# Patient Record
Sex: Male | Born: 1980 | Hispanic: No | Marital: Married | Smoking: Never smoker
Health system: Southern US, Community
[De-identification: ages and names within clinical notes are randomized; demographics above are authoritative.]

## PROBLEM LIST (undated history)

## (undated) DIAGNOSIS — M549 Dorsalgia, unspecified: Secondary | ICD-10-CM

## (undated) HISTORY — PX: ANKLE SURGERY: SHX546

---

## 2000-05-23 ENCOUNTER — Encounter: Payer: Self-pay | Admitting: Emergency Medicine

## 2000-05-23 ENCOUNTER — Emergency Department (HOSPITAL_COMMUNITY): Admission: EM | Admit: 2000-05-23 | Discharge: 2000-05-23 | Payer: Self-pay | Admitting: Emergency Medicine

## 2000-06-03 ENCOUNTER — Ambulatory Visit (HOSPITAL_COMMUNITY): Admission: RE | Admit: 2000-06-03 | Discharge: 2000-06-03 | Payer: Self-pay | Admitting: Neurology

## 2000-06-03 ENCOUNTER — Encounter: Payer: Self-pay | Admitting: Neurology

## 2004-11-30 ENCOUNTER — Emergency Department: Payer: Self-pay | Admitting: Emergency Medicine

## 2004-12-01 ENCOUNTER — Ambulatory Visit: Payer: Self-pay | Admitting: Emergency Medicine

## 2005-07-08 ENCOUNTER — Emergency Department: Payer: Self-pay | Admitting: Emergency Medicine

## 2008-07-30 ENCOUNTER — Encounter: Admission: RE | Admit: 2008-07-30 | Discharge: 2008-07-30 | Payer: Self-pay | Admitting: Family Medicine

## 2009-07-04 ENCOUNTER — Emergency Department: Payer: Self-pay | Admitting: Emergency Medicine

## 2009-10-05 ENCOUNTER — Emergency Department: Payer: Self-pay | Admitting: Emergency Medicine

## 2010-11-10 ENCOUNTER — Ambulatory Visit: Payer: Self-pay | Admitting: Family Medicine

## 2017-02-02 ENCOUNTER — Encounter: Payer: Self-pay | Admitting: *Deleted

## 2018-04-20 ENCOUNTER — Encounter: Payer: Self-pay | Admitting: Emergency Medicine

## 2018-04-20 ENCOUNTER — Other Ambulatory Visit: Payer: Self-pay

## 2018-04-20 ENCOUNTER — Emergency Department
Admission: EM | Admit: 2018-04-20 | Discharge: 2018-04-20 | Disposition: A | Discharge status: Home / Self Care | Payer: Worker's Compensation | Attending: Emergency Medicine | Admitting: Emergency Medicine

## 2018-04-20 DIAGNOSIS — Z79899 Other long term (current) drug therapy: Secondary | ICD-10-CM | POA: Insufficient documentation

## 2018-04-20 DIAGNOSIS — M545 Low back pain: Secondary | ICD-10-CM | POA: Diagnosis present

## 2018-04-20 DIAGNOSIS — M5442 Lumbago with sciatica, left side: Secondary | ICD-10-CM | POA: Diagnosis not present

## 2018-04-20 DIAGNOSIS — G8929 Other chronic pain: Secondary | ICD-10-CM

## 2018-04-20 HISTORY — DX: Dorsalgia, unspecified: M54.9

## 2018-04-20 MED ORDER — PREDNISONE 10 MG PO TABS: ORAL_TABLET | ORAL | 0 refills | Status: DC

## 2018-04-20 MED ORDER — HYDROMORPHONE HCL 1 MG/ML IJ SOLN
1.5000 mg | INTRAMUSCULAR | Status: AC
  Administered 2018-04-20: 1.5 mg via SUBCUTANEOUS
  Filled 2018-04-20: qty 2

## 2018-04-20 MED ORDER — PREDNISONE 20 MG PO TABS
60.0000 mg | ORAL_TABLET | ORAL | Status: AC
  Administered 2018-04-20: 60 mg via ORAL
  Filled 2018-04-20: qty 3

## 2018-04-20 MED ORDER — CYCLOBENZAPRINE HCL 5 MG PO TABS: 5.0000 mg | ORAL_TABLET | Freq: Three times a day (TID) | ORAL | 0 refills | Status: DC | PRN

## 2018-04-20 MED ORDER — LIDOCAINE 5 % EX PTCH
1.0000 | MEDICATED_PATCH | CUTANEOUS | Status: DC
  Administered 2018-04-20: 1 via TRANSDERMAL
  Filled 2018-04-20: qty 1

## 2018-04-20 MED ORDER — OXYCODONE-ACETAMINOPHEN 5-325 MG PO TABS: 2.0000 | ORAL_TABLET | Freq: Four times a day (QID) | ORAL | 0 refills | Status: DC | PRN

## 2018-04-20 MED ORDER — LIDOCAINE 5 % EX PTCH: 1.0000 | MEDICATED_PATCH | Freq: Two times a day (BID) | CUTANEOUS | 0 refills | Status: DC
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
## 2018-04-20 NOTE — Discharge Instructions (Addendum)
As we discussed, we recommend you try the medications prescribed tonight in addition to the medications you are currently taking.  It is important that you call your regular back doctor later today and tell them how much you are hurting after your physical therapy session.  I also provided 2 additional doctors with whom you can follow-up: A neurosurgeon for a second opinion, and a pain management doctor who may be able to help you with your chronic pain.  Take Percocet as prescribed for severe pain. Do not drink alcohol, drive or participate in any other potentially dangerous activities while taking this medication as it may make you sleepy. Do not take this medication with any other sedating medications, either prescription or over-the-counter. If you were prescribed Percocet or Vicodin, do not take these with acetaminophen (Tylenol) as it is already contained within these medications.   This medication is an opiate (or narcotic) pain medication and can be habit forming.  Use it as little as possible to achieve adequate pain control.  Do not use or use it with extreme caution if you have a history of opiate abuse or dependence.  If you are on a pain contract with your primary care doctor or a pain specialist, be sure to let them know you were prescribed this medication today from the [facility] Emergency Department.  This medication is intended for your use only - do not give any to anyone else and keep it in a secure place where nobody else, especially children, have access to it.  It will also cause or worsen constipation, so you may want to consider taking an over-the-counter stool softener while you are taking this medication.   Please follow up with your doctor as soon as possible regarding today's ED visit and your back pain.  Return to the ED for worsening back pain, fever, weakness or numbness of either leg, or if you develop either (1) an inability to urinate or have bowel movements, or (2)  loss of your ability to control your bathroom functions (if you start having "accidents"), or if you develop other new symptoms that concern you.
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
## 2018-12-17 ENCOUNTER — Other Ambulatory Visit: Payer: Self-pay | Admitting: Orthopaedic Surgery

## 2018-12-17 DIAGNOSIS — G8929 Other chronic pain: Secondary | ICD-10-CM

## 2018-12-18 ENCOUNTER — Telehealth: Payer: Self-pay | Admitting: Nurse Practitioner

## 2018-12-18 NOTE — Telephone Encounter (Signed)
Phone call to patient to verify medication list and allergies for myelogram procedure. Pt aware he will not need to hold and medications for this procedure. Pre and post procedure instructions reviewed with pt. Pt verbalized understanding.

## 2018-12-25 ENCOUNTER — Ambulatory Visit
Admission: RE | Admit: 2018-12-25 | Discharge: 2018-12-25 | Disposition: A | Discharge status: Home / Self Care | Payer: Self-pay | Source: Ambulatory Visit | Attending: Orthopaedic Surgery | Admitting: Orthopaedic Surgery

## 2018-12-25 ENCOUNTER — Other Ambulatory Visit: Payer: Self-pay | Admitting: Orthopaedic Surgery

## 2018-12-25 ENCOUNTER — Other Ambulatory Visit: Payer: Self-pay

## 2018-12-25 ENCOUNTER — Ambulatory Visit
Admission: RE | Admit: 2018-12-25 | Discharge: 2018-12-25 | Disposition: A | Discharge status: Home / Self Care | Payer: Worker's Compensation | Source: Ambulatory Visit | Attending: Orthopaedic Surgery | Admitting: Orthopaedic Surgery

## 2018-12-25 DIAGNOSIS — R52 Pain, unspecified: Secondary | ICD-10-CM

## 2018-12-25 DIAGNOSIS — M545 Low back pain, unspecified: Secondary | ICD-10-CM

## 2018-12-25 DIAGNOSIS — G8929 Other chronic pain: Secondary | ICD-10-CM

## 2018-12-25 MED ORDER — MEPERIDINE HCL 50 MG/ML IJ SOLN
50.0000 mg | Freq: Once | INTRAMUSCULAR | Status: AC
  Administered 2018-12-25: 50 mg via INTRAMUSCULAR

## 2018-12-25 MED ORDER — DIAZEPAM 5 MG PO TABS: 10.0000 mg | ORAL_TABLET | Freq: Once | ORAL | Status: DC

## 2018-12-25 MED ORDER — DIAZEPAM 5 MG PO TABS
10.0000 mg | ORAL_TABLET | Freq: Once | ORAL | Status: AC
  Administered 2018-12-25: 5 mg via ORAL

## 2018-12-25 MED ORDER — IOPAMIDOL (ISOVUE-M 200) INJECTION 41%
15.0000 mL | Freq: Once | INTRAMUSCULAR | Status: AC
  Administered 2018-12-25: 15 mL via INTRATHECAL

## 2018-12-25 MED ORDER — ONDANSETRON HCL 4 MG/2ML IJ SOLN
4.0000 mg | Freq: Once | INTRAMUSCULAR | Status: AC
  Administered 2018-12-25: 4 mg via INTRAMUSCULAR

## 2018-12-25 NOTE — Discharge Instructions (Signed)

## 2020-11-21 IMAGING — CT CT L SPINE W/ CM
1 of 6 series · 6 of 14 positions shown, 8 images · non-contrast
Comparison: 03/26/2018 lumbar spine MRI from [HOSPITAL]

CLINICAL DATA: Low back and left leg pain extending to the knee.
TECHNIQUE: Contiguous axial images were obtained through the Lumbar spine after
the intrathecal infusion of infusion. Coronal and sagittal
reconstructions were obtained of the axial image sets.

[Series 3: l spine soft · axial · 0.33mm/px · z∈[-299,-119]mm · 6 of 84 slices shown, 8 images]
[im 12/84  soft-tissue]
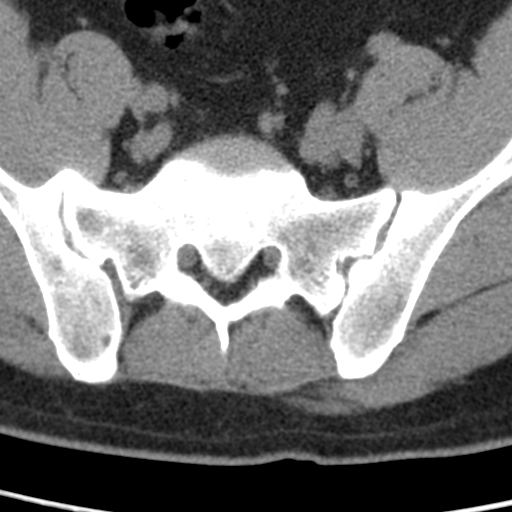
[im 12/84  bone]
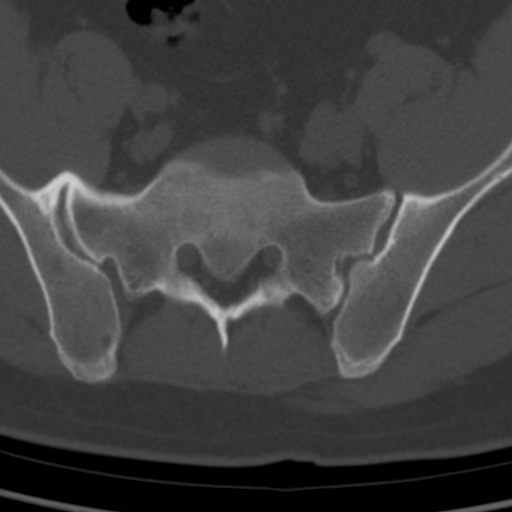
[im 24/84  bone]
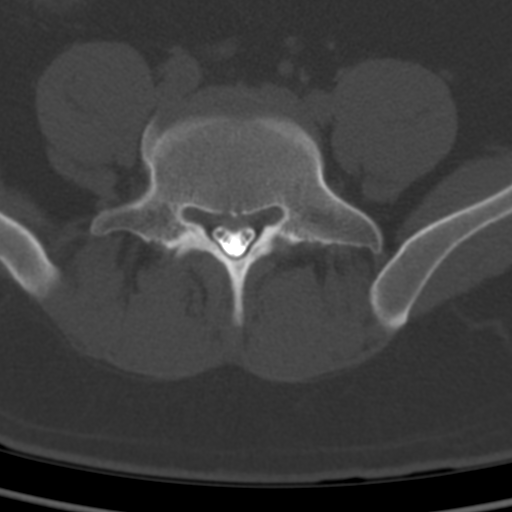
[im 36/84  bone]
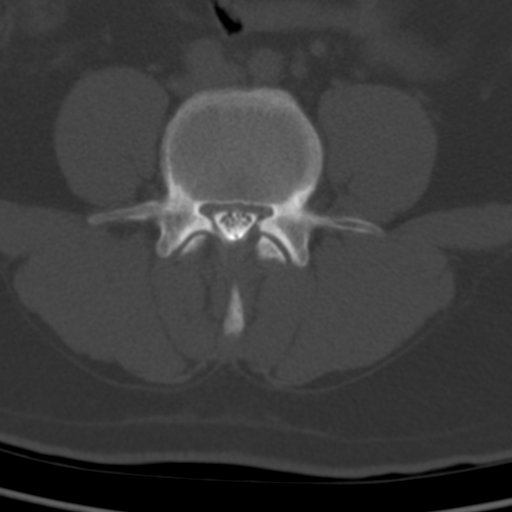
[im 48/84  bone]
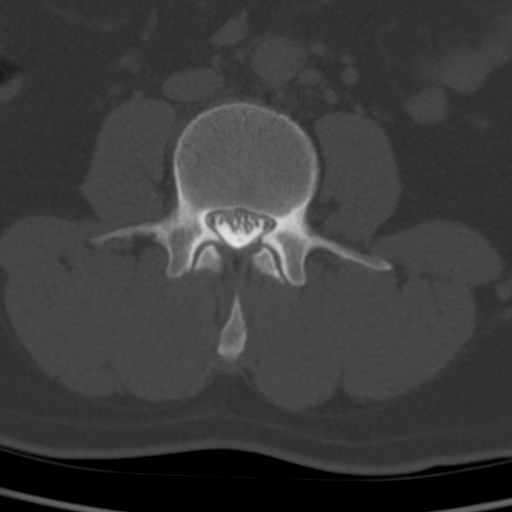
[im 60/84  soft-tissue]
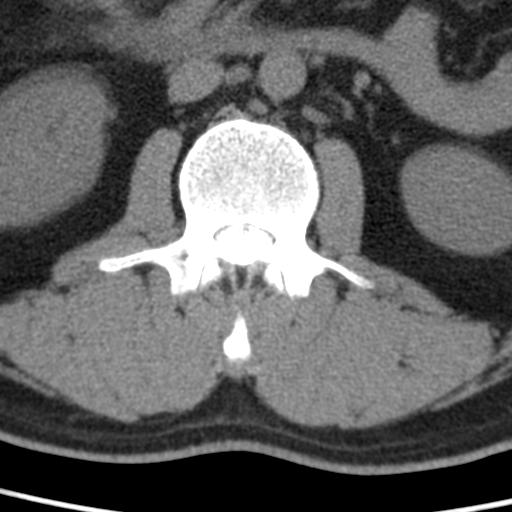
[im 60/84  bone]
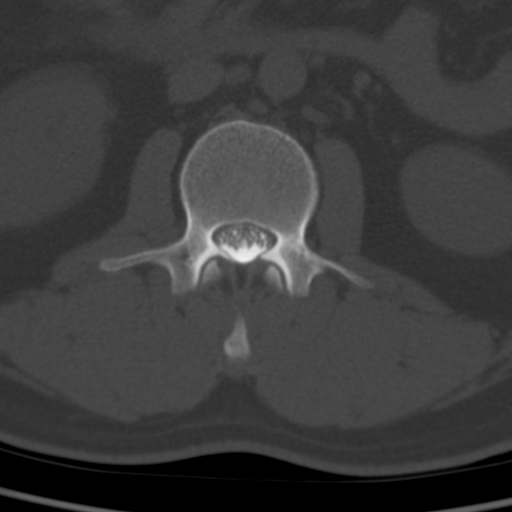
[im 72/84  bone]
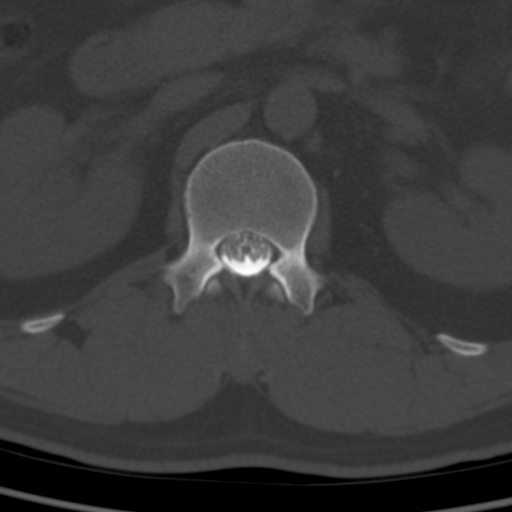

[6 of 14 positions shown; findings below may reference images not displayed]

EXAM:
LUMBAR MYELOGRAM

FLUOROSCOPY TIME:  Fluoroscopy Time: 13 seconds

Radiation Exposure Index: 261.09 microGray*m^2

PROCEDURE:
After thorough discussion of risks and benefits of the procedure
including bleeding, infection, injury to nerves, blood vessels,
adjacent structures as well as headache and CSF leak, written and
oral informed consent was obtained. Consent was obtained by Dr.
Laramie Ajayi. Time out form was completed.

Patient was positioned prone on the fluoroscopy table. Local
anesthesia was provided with 1% lidocaine without epinephrine after
prepped and draped in the usual sterile fashion. Puncture was
performed at L3-4 using a 3 1/2 inch 22-gauge spinal needle via a
right interlaminar approach. Using a single pass through the dura,
the needle was placed within the thecal sac, with return of clear
CSF. 15 mL of Isovue 0-S44 was injected into the thecal sac, with
normal opacification of the nerve roots and cauda equina consistent
with free flow within the subarachnoid space.

I personally performed the lumbar puncture and administered the
intrathecal contrast. I also personally supervised acquisition of
the myelogram images.
FINDINGS: LUMBAR MYELOGRAM FINDINGS:

There are 5 non rib-bearing lumbar vertebrae. The left L5 transverse
process is mildly enlarged and demonstrates a pseudoarticulation
with the sacrum. Vertebral alignment is normal, and no abnormal
motion is evident on flexion and extension radiographs. There is a
small ventral extradural defect at L4-5 which is slightly more
prominent with standing. There is no evidence of significant spinal
stenosis, and the other lumbar levels are unremarkable. There is
symmetric opacification of the nerve root sleeves.

CT LUMBAR MYELOGRAM FINDINGS:

Vertebral alignment is normal. No fracture or suspicious osseous
lesion is identified. The conus medullaris terminates at L1. There
is a punctate nonobstructing calculus in the upper pole of the left
kidney.

T12-L1 through L2-3: Negative.

L3-4: Minimal disc bulging, minimal endplate spurring, congenitally
short pedicles result in borderline to mild right neural foraminal
stenosis without spinal stenosis, unchanged.

L4-5: Mild disc space narrowing. Mild disc bulging, mild facet and
ligamentum flavum hypertrophy, and congenitally short pedicles
result in mild-to-moderate bilateral lateral recess stenosis and
mild-to-moderate right and mild left neural foraminal stenosis
without significant generalized spinal stenosis, unchanged. The L5
nerve roots could be affected in the lateral recesses.

L5-S1 negative.
IMPRESSION: 1. Mild disc and facet degeneration at L4-5 with mild-to-moderate
lateral recess and neural foraminal stenosis.
2. Mild right neural foraminal stenosis at L3-4.
3. Nonobstructing left renal calculus.

## 2022-03-22 ENCOUNTER — Emergency Department: Payer: Self-pay

## 2022-03-22 DIAGNOSIS — R0789 Other chest pain: Secondary | ICD-10-CM | POA: Insufficient documentation

## 2022-03-22 LAB — COMPREHENSIVE METABOLIC PANEL
ALT: 27 U/L (ref 0–44)
AST: 27 U/L (ref 15–41)
Albumin: 4.4 g/dL (ref 3.5–5.0)
Alkaline Phosphatase: 69 U/L (ref 38–126)
Anion gap: 11 (ref 5–15)
BUN: 23 mg/dL — ABNORMAL HIGH (ref 6–20)
CO2: 25 mmol/L (ref 22–32)
Calcium: 9.1 mg/dL (ref 8.9–10.3)
Chloride: 102 mmol/L (ref 98–111)
Creatinine, Ser: 1.02 mg/dL (ref 0.61–1.24)
GFR, Estimated: >60 mL/min (ref >60)
Glucose, Bld: 97 mg/dL (ref 70–99)
Potassium: 3.9 mmol/L (ref 3.5–5.1)
Sodium: 138 mmol/L (ref 135–145)
Total Bilirubin: 1.3 mg/dL — ABNORMAL HIGH (ref 0.3–1.2)
Total Protein: 7.1 g/dL (ref 6.5–8.1)

## 2022-03-22 LAB — TROPONIN I (HIGH SENSITIVITY)
Troponin I (High Sensitivity): 3 ng/L (ref <18)
Troponin I (High Sensitivity): <2 ng/L (ref <18)

## 2022-03-22 LAB — CBC WITH DIFFERENTIAL/PLATELET
Abs Immature Granulocytes: 0.03 10*3/uL (ref 0.00–0.07)
Basophils Absolute: 0 10*3/uL (ref 0.0–0.1)
Basophils Relative: 0 %
Eosinophils Absolute: 0.2 10*3/uL (ref 0.0–0.5)
Eosinophils Relative: 3 %
HCT: 46 % (ref 39.0–52.0)
Hemoglobin: 16.3 g/dL (ref 13.0–17.0)
Immature Granulocytes: 0 %
Lymphocytes Relative: 37 %
Lymphs Abs: 2.5 10*3/uL (ref 0.7–4.0)
MCH: 29.6 pg (ref 26.0–34.0)
MCHC: 35.4 g/dL (ref 30.0–36.0)
MCV: 83.6 fL (ref 80.0–100.0)
Monocytes Absolute: 0.5 10*3/uL (ref 0.1–1.0)
Monocytes Relative: 7 %
Neutro Abs: 3.5 10*3/uL (ref 1.7–7.7)
Neutrophils Relative %: 53 %
Platelets: 213 10*3/uL (ref 150–400)
RBC: 5.5 MIL/uL (ref 4.22–5.81)
RDW: 11.9 % (ref 11.5–15.5)
WBC: 6.7 10*3/uL (ref 4.0–10.5)
nRBC: 0 % (ref 0.0–0.2)

## 2022-03-22 NOTE — ED Triage Notes (Signed)
Ambulatory to triage with c/o hypertension, chest pain, and left arm pain. Recently began medication for hypertension but unsure what.  Sx began before Christmas, but pt states after BP readings at home tonight he became more concerned. Unsure how high BP was at home.

## 2022-03-22 NOTE — ED Provider Triage Note (Signed)
Emergency Medicine Provider Triage Evaluation Note  Jeffery Pena, a 42 y.o. male  was evaluated in triage.  Pt complains of elevated BP readings. Pt recently started on HTN meds, unclear of name. He also endorses chest pain and left arm pain.   Review of Systems  Positive: CP, high BP Negative: FCS  Physical Exam  BP (!) 133/94 (BP Location: Right Arm)   Pulse 89   Temp 98.4 F (36.9 C) (Oral)   Resp 18   Ht 5\' 10"  (1.778 m)   Wt 86.2 kg   SpO2 99%   BMI 27.26 kg/m  Gen:   Awake, no distress  NAD Resp:  Normal effort CTA MSK:   Moves extremities without difficulty  Other:    Medical Decision Making  Medically screening exam initiated at 8:27 PM.  Appropriate orders placed.  Jeffery Pena was informed that the remainder of the evaluation will be completed by another provider, this initial triage assessment does not replace that evaluation, and the importance of remaining in the ED until their evaluation is complete.  Patient to the ED for evaluation of elevated BP, CP, and LUE pain.    Melvenia Needles, PA-C 03/22/22 2028

## 2022-03-23 ENCOUNTER — Emergency Department
Admission: EM | Admit: 2022-03-23 | Discharge: 2022-03-23 | Disposition: A | Discharge status: Home / Self Care | Payer: Self-pay | Attending: Emergency Medicine | Admitting: Emergency Medicine

## 2022-03-23 DIAGNOSIS — R079 Chest pain, unspecified: Secondary | ICD-10-CM

## 2022-03-23 DIAGNOSIS — R0789 Other chest pain: Secondary | ICD-10-CM

## 2022-03-23 NOTE — Discharge Instructions (Signed)
Tome paracetamol 650 mg e ibuprofeno 400 mg cada 6 horas para el dolor. Tomar con la comida.   Gracias por elegirnos para el cuidado de su salud hoy!  Consulte a su mdico de atencin primaria esta semana para una cita de seguimiento.  Si no tiene un mdico de atencin primaria llame a las siguientes clnicas para establecer la atencin:  Si tienes seguro: [facility] [phone] [address] [place] [address]   [facility] [phone] [address] [place] [address]   Si no tienes seguro: [facility] [phone] [address] [place] [address]  A veces, en las primeras etapas del curso de ciertas enfermedades es difcil detectarlo en la evaluacin del departamento de emergencias; por lo tanto, es importante que contine monitoreando sus sntomas y llame a su mdico de inmediato o regrese al departamento de emergencias si desarrolla alguno. sntomas nuevos o que empeoran.  Fue un placer cuidar de usted hoy.  Mclean Moya S. Franki Alcaide, MD   

## 2022-03-23 NOTE — ED Provider Notes (Signed)
[facility] Provider Note    Event Date/Time   First MD Initiated Contact with Patient 03/23/22 0030     (approximate)   History   Chest Pain   HPI  Claud Engelbert Sevin is a 42 y.o. male   Past medical history of no significant past medical history presents to the emergency department with chest pain.  Started around Christmas time and has been constant and slightly progressing in severity over the last several weeks.  Left-sided chest pain radiates to the arm left side.  Nonexertional, no respiratory infectious complaints no other acute medical complaints.  Non-smoker no cardiac risk factors known to this patient.  No drug use no cocaine use.  No other exacerbating or alleviating symptoms.  No injuries.    History was obtained via the patient via [place] interpreter.  His son is available as independent historian at bedside.      Physical Exam   Triage Vital Signs: ED Triage Vitals  Enc Vitals Group     BP 03/22/22 2010 (!) 133/94     Pulse Rate 03/22/22 2010 89     Resp 03/22/22 2010 18     Temp 03/22/22 2010 98.4 F (36.9 C)     Temp Source 03/22/22 2010 Oral     SpO2 03/22/22 2010 99 %     Weight 03/22/22 2011 190 lb (86.2 kg)     Height 03/22/22 2011 5\' 10"  (1.778 m)     Head Circumference --      Peak Flow --      Pain Score 03/22/22 2011 4     Pain Loc --      Pain Edu? --      Excl. in [place]? --     Most recent vital signs: Vitals:   03/22/22 2010 03/22/22 2241  BP: (!) 133/94 (!) 147/96  Pulse: 89 71  Resp: 18 20  Temp: 98.4 F (36.9 C)   SpO2: 99% 98%    General: Awake, no distress.  CV:  Good peripheral perfusion.  Resp:  Normal effort.  Abd:  No distention.  Other:  Wake alert oriented comfortable appearing.  Heart sounds without murmurs, lungs clear to auscultation abdomen soft and nontender radial pulses intact and equal bilaterally.  Slightly hypertensive 130s over 90s otherwise hemodynamics appropriate  reassuring.   ED Results / Procedures / Treatments   Labs (all labs ordered are listed, but only abnormal results are displayed) Labs Reviewed  COMPREHENSIVE METABOLIC PANEL - Abnormal; Notable for the following components:      Result Value   BUN 23 (*)    Total Bilirubin 1.3 (*)    All other components within normal limits  CBC WITH DIFFERENTIAL/PLATELET  TROPONIN I (HIGH SENSITIVITY)  TROPONIN I (HIGH SENSITIVITY)     I reviewed labs and they are notable for troponin x 2 flat neg  EKG  ED ECG REPORT I, Lucillie Garfinkel, the attending physician, personally viewed and interpreted this ECG.   Date: 03/23/2022  EKG Time: 2014  Rate: 87  Rhythm: normal sinus rhythm  Axis: nl  Intervals:none  ST&T Change: No acute ischemic changes    RADIOLOGY I independently reviewed and interpreted chest x-ray and see no obvious focalities or pneumothorax   PROCEDURES:  Critical Care performed: No  Procedures   MEDICATIONS ORDERED IN ED: Medications - No data to display    IMPRESSION / MDM / [place] / ED COURSE  I reviewed the triage vital signs  and the nursing notes.                              Differential diagnosis includes, but is not limited to, ACS, PE, dissection, musculoskeletal pain, respiratory infection    MDM: This is a patient with atypical chest pain and no cardiac risk factors with negative troponin x 2 and a nonischemic EKG.  Very low clinical suspicion for dissection or PE.  No abdominal complaints and nontender abdomen so I doubt emergent abdominal pathologies.  He was informed to follow-up with PMD and referral made to cardiology.  Return precautions given.  Patient's presentation is most consistent with acute presentation with potential threat to life or bodily function.       FINAL CLINICAL IMPRESSION(S) / ED DIAGNOSES   Final diagnoses:  Atypical chest pain  Chest pain, unspecified type     Rx / DC Orders   ED Discharge  Orders          Ordered    Ambulatory referral to Cardiology        03/23/22 0113             Note:  This document was prepared using Dragon voice recognition software and may include unintentional dictation errors.    Lucillie Garfinkel, MD 03/23/22 [phone]

## 2022-05-04 ENCOUNTER — Ambulatory Visit: Payer: Self-pay | Attending: Internal Medicine | Admitting: Internal Medicine

## 2022-05-04 NOTE — Progress Notes (Deleted)
   New Outpatient Visit Date: 05/04/2022  Referring Provider: [facility] [place],  [place] [address]  Chief Complaint: ***  HPI:  Mr. Jeffery Pena is a 42 y.o. male who is being seen today for the evaluation of chest pain at the request of Dr. Jacelyn Grip. He has a history of ***. ***  --------------------------------------------------------------------------------------------------  Cardiovascular History & Procedures: Cardiovascular Problems: ***  Risk Factors: ***  Cath/PCI: ***  CV Surgery: ***  EP Procedures and Devices: ***  Non-Invasive Evaluation(s): ***  Recent CV Pertinent Labs: Lab Results  Component Value Date   K 3.9 03/22/2022   BUN 23 (H) 03/22/2022   CREATININE 1.02 03/22/2022    --------------------------------------------------------------------------------------------------  Past Medical History:  Diagnosis Date   Back pain     Past Surgical History:  Procedure Laterality Date   ANKLE SURGERY      No outpatient medications have been marked as taking for the 05/04/22 encounter (Appointment) with Keylani Perlstein, Harrell Gave, MD.    Allergies: Patient has no known allergies.  Social History   Tobacco Use   Smoking status: Never   Smokeless tobacco: Never  Vaping Use   Vaping Use: Never used  Substance Use Topics   Alcohol use: Not Currently   Drug use: Never    No family history on file.  Review of Systems: A 12-system review of systems was performed and was negative except as noted in the HPI.  --------------------------------------------------------------------------------------------------  Physical Exam: There were no vitals taken for this visit.  General:  *** HEENT: No conjunctival pallor or scleral icterus. Neck: Supple without lymphadenopathy, thyromegaly, JVD, or HJR. No carotid bruit. Lungs: Normal work of breathing. Clear to auscultation bilaterally without wheezes or  crackles. Heart: Regular rate and rhythm without murmurs, rubs, or gallops. Non-displaced PMI. Abd: Bowel sounds present. Soft, NT/ND without hepatosplenomegaly Ext: No lower extremity edema. Radial, PT, and DP pulses are 2+ bilaterally Skin: Warm and dry without rash. Neuro: CNIII-XII intact. Strength and fine-touch sensation intact in upper and lower extremities bilaterally. Psych: Normal mood and affect.  EKG:  ***  Lab Results  Component Value Date   WBC 6.7 03/22/2022   HGB 16.3 03/22/2022   HCT 46.0 03/22/2022   MCV 83.6 03/22/2022   PLT 213 03/22/2022    Lab Results  Component Value Date   NA 138 03/22/2022   K 3.9 03/22/2022   CL 102 03/22/2022   CO2 25 03/22/2022   BUN 23 (H) 03/22/2022   CREATININE 1.02 03/22/2022   GLUCOSE 97 03/22/2022   ALT 27 03/22/2022    No results found for: "CHOL", "HDL", "LDLCALC", "LDLDIRECT", "TRIG", "CHOLHDL"   --------------------------------------------------------------------------------------------------  ASSESSMENT AND PLAN: ***  Nelva Bush, MD 05/04/2022 8:51 AM

## 2022-05-05 ENCOUNTER — Encounter: Payer: Self-pay | Admitting: Internal Medicine

## 2023-04-01 ENCOUNTER — Emergency Department: Payer: Self-pay

## 2023-04-01 ENCOUNTER — Emergency Department
Admission: EM | Admit: 2023-04-01 | Discharge: 2023-04-01 | Disposition: A | Discharge status: Home / Self Care | Payer: Self-pay | Attending: Emergency Medicine | Admitting: Emergency Medicine

## 2023-04-01 ENCOUNTER — Other Ambulatory Visit: Payer: Self-pay

## 2023-04-01 DIAGNOSIS — R1010 Upper abdominal pain, unspecified: Secondary | ICD-10-CM

## 2023-04-01 DIAGNOSIS — K2900 Acute gastritis without bleeding: Secondary | ICD-10-CM

## 2023-04-01 LAB — CBC
HCT: 45.4 % (ref 39.0–52.0)
Hemoglobin: 15.8 g/dL (ref 13.0–17.0)
MCH: 30 pg (ref 26.0–34.0)
MCHC: 34.8 g/dL (ref 30.0–36.0)
MCV: 86.1 fL (ref 80.0–100.0)
Platelets: 210 10*3/uL (ref 150–400)
RBC: 5.27 MIL/uL (ref 4.22–5.81)
RDW: 11.9 % (ref 11.5–15.5)
WBC: 4.6 10*3/uL (ref 4.0–10.5)
nRBC: 0 % (ref 0.0–0.2)

## 2023-04-01 LAB — LIPASE, BLOOD: Lipase: 41 U/L (ref 11–51)

## 2023-04-01 LAB — COMPREHENSIVE METABOLIC PANEL
ALT: 24 U/L (ref 0–44)
AST: 23 U/L (ref 15–41)
Albumin: 4.1 g/dL (ref 3.5–5.0)
Alkaline Phosphatase: 71 U/L (ref 38–126)
Anion gap: 10 (ref 5–15)
BUN: 20 mg/dL (ref 6–20)
CO2: 24 mmol/L (ref 22–32)
Calcium: 8.7 mg/dL — ABNORMAL LOW (ref 8.9–10.3)
Chloride: 103 mmol/L (ref 98–111)
Creatinine, Ser: 1.01 mg/dL (ref 0.61–1.24)
GFR, Estimated: >60 mL/min (ref >60)
Glucose, Bld: 94 mg/dL (ref 70–99)
Potassium: 3.6 mmol/L (ref 3.5–5.1)
Sodium: 137 mmol/L (ref 135–145)
Total Bilirubin: 1.5 mg/dL — ABNORMAL HIGH (ref 0.0–1.2)
Total Protein: 7.1 g/dL (ref 6.5–8.1)

## 2023-04-01 MED ORDER — FAMOTIDINE 20 MG PO TABS
40.0000 mg | ORAL_TABLET | Freq: Once | ORAL | Status: AC
  Administered 2023-04-01: 40 mg via ORAL
  Filled 2023-04-01: qty 2

## 2023-04-01 MED ORDER — KETOROLAC TROMETHAMINE 15 MG/ML IJ SOLN
15.0000 mg | Freq: Once | INTRAMUSCULAR | Status: AC
  Administered 2023-04-01: 15 mg via INTRAMUSCULAR
  Filled 2023-04-01: qty 1

## 2023-04-01 MED ORDER — SUCRALFATE 1 G PO TABS
1.0000 g | ORAL_TABLET | Freq: Once | ORAL | Status: AC
  Administered 2023-04-01: 1 g via ORAL
  Filled 2023-04-01: qty 1

## 2023-04-01 MED ORDER — SUCRALFATE 1 G PO TABS: 1.0000 g | ORAL_TABLET | Freq: Four times a day (QID) | ORAL | 1 refills | Status: DC

## 2023-04-01 MED ORDER — FAMOTIDINE 20 MG PO TABS: 20.0000 mg | ORAL_TABLET | Freq: Two times a day (BID) | ORAL | 0 refills | Status: DC

## 2023-04-01 NOTE — ED Triage Notes (Signed)
 Pt to ED via POV from home. Pt reports abd pain above umbilical that started this morning. Pt denies N/V/D.

## 2023-04-01 NOTE — ED Triage Notes (Signed)
 First Nurse note:  Per EMS, Pt, from home, c/o upper abdominal pain starting around 0900.  Denies n/v/d.    HR 71 BP 123/88 O2 100%  CBG 126

## 2023-04-01 NOTE — ED Notes (Signed)
 Ultrasound is in the room. Will medicate when the tech leaves.

## 2023-04-01 NOTE — ED Provider Notes (Signed)
 [facility] Provider Note    Event Date/Time   First MD Initiated Contact with Patient 04/01/23 1601     (approximate)   History   Chief Complaint: Abdominal Pain   HPI  Blanche Pearse Shiffler is a 43 y.o. male with no past medical history who comes ED complaining of epigastric pain that is nonradiating, started this morning at about 9:00 AM.,  Started after eating breakfast.  Waxing and waning, no other aggravating or alleviating factors.  Denies vomiting nausea or diarrhea.  No trauma, no fever.  Does report having a similar episode 1 week ago which resolved on its own.  Denies alcohol use          Physical Exam   Triage Vital Signs: ED Triage Vitals  Encounter Vitals Group     BP 04/01/23 1336 122/87     Systolic BP Percentile --      Diastolic BP Percentile --      Pulse Rate 04/01/23 1336 65     Resp 04/01/23 1338 20     Temp 04/01/23 1336 98.8 F (37.1 C)     Temp Source 04/01/23 1336 Oral     SpO2 04/01/23 1336 98 %     Weight --      Height --      Head Circumference --      Peak Flow --      Pain Score 04/01/23 1337 7     Pain Loc --      Pain Education --      Exclude from Growth Chart --     Most recent vital signs: Vitals:   04/01/23 1338 04/01/23 1729  BP:  117/82  Pulse:  65  Resp: 20 18  Temp:    SpO2:  99%    General: Awake, no distress.  CV:  Good peripheral perfusion.  Regular rate rhythm Resp:  Normal effort.  Clear to auscultation bilaterally Abd:  No distention.  Soft with epigastric and right upper quadrant tenderness Other:  No lower extremity edema, no rash   ED Results / Procedures / Treatments   Labs (all labs ordered are listed, but only abnormal results are displayed) Labs Reviewed  COMPREHENSIVE METABOLIC PANEL - Abnormal; Notable for the following components:      Result Value   Calcium 8.7 (*)    Total Bilirubin 1.5 (*)    All other components within normal limits  LIPASE, BLOOD  CBC   URINALYSIS, ROUTINE W REFLEX MICROSCOPIC     EKG Interpreted by me Normal sinus rhythm rate of 72.  Normal axis intervals QRS ST segments and T waves   RADIOLOGY Ultrasound interpreted by me, negative for cholecystitis.  Radiology report reviewed   PROCEDURES:  Procedures   MEDICATIONS ORDERED IN ED: Medications  ketorolac  (TORADOL ) 15 MG/ML injection 15 mg (15 mg Intramuscular Given 04/01/23 1726)  famotidine  (PEPCID ) tablet 40 mg (40 mg Oral Given 04/01/23 1722)  sucralfate  (CARAFATE ) tablet 1 g (1 g Oral Given 04/01/23 1722)     IMPRESSION / MDM / ASSESSMENT AND PLAN / ED COURSE  I reviewed the triage vital signs and the nursing notes.  DDx: Gastritis, pancreatitis, cholecystitis, biliary colic  Patient's presentation is most consistent with acute presentation with potential threat to life or bodily function.  Patient presents with upper abdominal pain and tenderness.  Vital signs are normal, initial labs are normal.  Will give IM Toradol , antacids, obtain ultrasound right upper quadrant.   -----------------------------------------  6:16 PM on 04/01/2023 ----------------------------------------- Workup normal.  Feeling better, stable for discharge      FINAL CLINICAL IMPRESSION(S) / ED DIAGNOSES   Final diagnoses:  Pain of upper abdomen  Acute gastritis without hemorrhage, unspecified gastritis type     Rx / DC Orders   ED Discharge Orders          Ordered    sucralfate  (CARAFATE ) 1 g tablet  4 times daily        04/01/23 1815    famotidine  (PEPCID ) 20 MG tablet  2 times daily        04/01/23 1815             Note:  This document was prepared using Dragon voice recognition software and may include unintentional dictation errors.   Viviann Pastor, MD 04/01/23 1816

## 2023-04-01 NOTE — ED Provider Triage Note (Signed)
 Emergency Medicine Provider Triage Evaluation Note  Jeffery Pena , a 43 y.o. male  was evaluated in triage.  Pt complains of pain above the belly button since this morning. Pain occasionally radiates to the left side. No nausea or vomiting.  Physical Exam  There were no vitals taken for this visit. Gen:   Awake, no distress   Resp:  Normal effort  MSK:   Moves extremities without difficulty  Other:    Medical Decision Making  Medically screening exam initiated at 1:35 PM.  Appropriate orders placed.  Jeffery Pena was informed that the remainder of the evaluation will be completed by another provider, this initial triage assessment does not replace that evaluation, and the importance of remaining in the ED until their evaluation is complete.  Abdominal pain protocol started.   [name], FNP 04/01/23 1337

## 2023-09-20 ENCOUNTER — Ambulatory Visit
Admission: EM | Admit: 2023-09-20 | Discharge: 2023-09-20 | Disposition: A | Discharge status: Other Acute Inpatient Hospital | Payer: Self-pay

## 2023-09-20 DIAGNOSIS — R1084 Generalized abdominal pain: Secondary | ICD-10-CM

## 2023-09-20 NOTE — ED Provider Notes (Signed)
 [facility]    CSN: [id number] Arrival date & time: 09/20/23  1459      History   Chief Complaint Chief Complaint  Patient presents with   Diarrhea    HPI Jeffery Pena is a 43 y.o. male.   HPI  43 year old male with no significant past medical history presents for evaluation of 10 days worth of abdominal pain and diarrhea.  He reports that he will typically have 3-4 episodes of diarrhea a day.  No associated blood or fever.  No known sick contacts or recent travel.  He reports that he can eat and drink though it does precipitate a trip to the bathroom.  He is starting to come dizzy and also complaining of being lightheaded.  No syncope.  He had 1 episode of vomiting today.  Spanish interpreter [phone] used for history, physical, and discharge.  Past Medical History:  Diagnosis Date   Back pain     There are no active problems to display for this patient.   Past Surgical History:  Procedure Laterality Date   ANKLE SURGERY         Home Medications    Prior to Admission medications   Not on File    Family History History reviewed. No pertinent family history.  Social History Social History   Tobacco Use   Smoking status: Never   Smokeless tobacco: Never  Vaping Use   Vaping status: Never Used  Substance Use Topics   Alcohol use: Not Currently   Drug use: Never     Allergies   Patient has no known allergies.   Review of Systems Review of Systems  Constitutional:  Negative for fever.  Gastrointestinal:  Positive for abdominal pain, diarrhea, nausea and vomiting. Negative for blood in stool.  Neurological:  Positive for dizziness. Negative for syncope.     Physical Exam Triage Vital Signs ED Triage Vitals  Encounter Vitals Group     BP      Girls Systolic BP Percentile      Girls Diastolic BP Percentile      Boys Systolic BP Percentile      Boys Diastolic BP Percentile      Pulse      Resp      Temp      Temp  src      SpO2      Weight      Height      Head Circumference      Peak Flow      Pain Score      Pain Loc      Pain Education      Exclude from Growth Chart    No data found.  Updated Vital Signs Pulse 65   Temp 99.4 F (37.4 C) (Oral)   Resp 18   SpO2 96%   Visual Acuity Right Eye Distance:   Left Eye Distance:   Bilateral Distance:    Right Eye Near:   Left Eye Near:    Bilateral Near:     Physical Exam Vitals and nursing note reviewed.  Constitutional:      Appearance: Normal appearance. He is not ill-appearing.  HENT:     Head: Normocephalic and atraumatic.  Cardiovascular:     Rate and Rhythm: Normal rate and regular rhythm.     Pulses: Normal pulses.     Heart sounds: Normal heart sounds. No murmur heard.    No friction rub. No gallop.  Pulmonary:  Effort: Pulmonary effort is normal.     Breath sounds: Normal breath sounds. No wheezing, rhonchi or rales.  Abdominal:     General: Abdomen is flat.     Palpations: Abdomen is soft.     Tenderness: There is abdominal tenderness. There is no guarding or rebound.     Comments: Tender in the epigastrium, left upper quadrant, and left lower quadrant.  No guarding or rebound.  Right side of the abdomen is benign.  Skin:    General: Skin is warm and dry.     Capillary Refill: Capillary refill takes less than 2 seconds.     Findings: No rash.  Neurological:     Mental Status: He is alert.      UC Treatments / Results  Labs (all labs ordered are listed, but only abnormal results are displayed) Labs Reviewed - No data to display  EKG   Radiology No results found.  Procedures Procedures (including critical care time)  Medications Ordered in UC Medications - No data to display  Initial Impression / Assessment and Plan / UC Course  I have reviewed the triage vital signs and the nursing notes.  Pertinent labs & imaging results that were available during my care of the patient were reviewed by me  and considered in my medical decision making (see chart for details).   Patient is a pleasant 43 year old male presenting for evaluation of 10 days worth of abdominal pain and diarrhea with new onset nausea and vomiting today.  He is also now complaining of dizziness and feeling weak.  He reports that he has been able to eat and drink though every time he eats or drinks it precipitates a diarrhea stool.  He has no previous history of GI illnesses and he denies fever.  On exam his abdomen is soft and flat with epigastric, left upper, and left lower quadrant tenderness.  No guarding or rebound.  Differential diagnosis include gastroenteritis, colitis, IBS, diverticulitis.  I do feel that the patient needs imaging of his abdomen which cannot be accomplished here in urgent care so I have referred him to the emergency department.  He has elected to go to [facility] for further evaluation.   Final Clinical Impressions(s) / UC Diagnoses   Final diagnoses:  Generalized abdominal pain     Discharge Instructions      Please go to the emergency department for further evaluation of your abdominal pain and diarrhea.     ED Prescriptions   None    PDMP not reviewed this encounter.   Bernardino Ditch, NP 09/20/23 1558

## 2023-09-20 NOTE — Discharge Instructions (Addendum)
 Please go to the emergency department for further evaluation of your abdominal pain and diarrhea.

## 2023-09-20 NOTE — ED Notes (Signed)
 Patient is being discharged from the Urgent Care and sent to the Emergency Department via POV . Per Harriette, NP, patient is in need of higher level of care for further imaging to rule out diverticulitis or colitis. Patient is aware and verbalizes understanding of plan of care.  Vitals:   09/20/23 1543  Pulse: 65  Resp: 18  Temp: 99.4 F (37.4 C)  SpO2: 96%

## 2023-09-20 NOTE — ED Triage Notes (Signed)
 Pt states he has been having diarrhea since last Monday, 6/23. Patient states he has also vomited a few times in the last couple of days. States as soon as he eats he has to run to the bathroom with diarrhea.

## 2023-12-12 ENCOUNTER — Other Ambulatory Visit: Payer: Self-pay

## 2023-12-12 ENCOUNTER — Emergency Department: Payer: Self-pay

## 2023-12-12 ENCOUNTER — Emergency Department
Admission: EM | Admit: 2023-12-12 | Discharge: 2023-12-12 | Disposition: A | Discharge status: Home / Self Care | Payer: Self-pay | Attending: Emergency Medicine | Admitting: Emergency Medicine

## 2023-12-12 DIAGNOSIS — R1032 Left lower quadrant pain: Secondary | ICD-10-CM | POA: Insufficient documentation

## 2023-12-12 DIAGNOSIS — R1012 Left upper quadrant pain: Secondary | ICD-10-CM | POA: Insufficient documentation

## 2023-12-12 DIAGNOSIS — R109 Unspecified abdominal pain: Secondary | ICD-10-CM

## 2023-12-12 LAB — HEPATIC FUNCTION PANEL
ALT: 19 U/L (ref 0–44)
AST: 20 U/L (ref 15–41)
Albumin: 4.2 g/dL (ref 3.5–5.0)
Alkaline Phosphatase: 58 U/L (ref 38–126)
Bilirubin, Direct: 0.2 mg/dL (ref 0.0–0.2)
Indirect Bilirubin: 1.5 mg/dL — ABNORMAL HIGH (ref 0.3–0.9)
Total Bilirubin: 1.7 mg/dL — ABNORMAL HIGH (ref 0.0–1.2)
Total Protein: 6.8 g/dL (ref 6.5–8.1)

## 2023-12-12 LAB — CBC
HCT: 48.1 % (ref 39.0–52.0)
Hemoglobin: 16.9 g/dL (ref 13.0–17.0)
MCH: 29.8 pg (ref 26.0–34.0)
MCHC: 35.1 g/dL (ref 30.0–36.0)
MCV: 84.7 fL (ref 80.0–100.0)
Platelets: 209 K/uL (ref 150–400)
RBC: 5.68 MIL/uL (ref 4.22–5.81)
RDW: 12.2 % (ref 11.5–15.5)
WBC: 4.7 K/uL (ref 4.0–10.5)
nRBC: 0 % (ref 0.0–0.2)

## 2023-12-12 LAB — BASIC METABOLIC PANEL WITH GFR
Anion gap: 9 (ref 5–15)
BUN: 13 mg/dL (ref 6–20)
CO2: 24 mmol/L (ref 22–32)
Calcium: 9 mg/dL (ref 8.9–10.3)
Chloride: 104 mmol/L (ref 98–111)
Creatinine, Ser: 0.9 mg/dL (ref 0.61–1.24)
GFR, Estimated: >60 mL/min (ref >60)
Glucose, Bld: 93 mg/dL (ref 70–99)
Potassium: 3.9 mmol/L (ref 3.5–5.1)
Sodium: 137 mmol/L (ref 135–145)

## 2023-12-12 LAB — TROPONIN I (HIGH SENSITIVITY): Troponin I (High Sensitivity): <2 ng/L (ref <18)

## 2023-12-12 LAB — LIPASE, BLOOD: Lipase: 36 U/L (ref 11–51)

## 2023-12-12 MED ORDER — ONDANSETRON HCL 4 MG/2ML IJ SOLN
4.0000 mg | Freq: Once | INTRAMUSCULAR | Status: AC
  Administered 2023-12-12: 4 mg via INTRAVENOUS
  Filled 2023-12-12: qty 2

## 2023-12-12 MED ORDER — DICYCLOMINE HCL 10 MG PO CAPS: 10.0000 mg | ORAL_CAPSULE | Freq: Three times a day (TID) | ORAL | 0 refills | Status: AC | PRN

## 2023-12-12 MED ORDER — SODIUM CHLORIDE 0.9 % IV BOLUS
1000.0000 mL | Freq: Once | INTRAVENOUS | Status: AC
  Administered 2023-12-12: 1000 mL via INTRAVENOUS

## 2023-12-12 MED ORDER — IOHEXOL 300 MG/ML  SOLN
100.0000 mL | Freq: Once | INTRAMUSCULAR | Status: AC | PRN
  Administered 2023-12-12: 100 mL via INTRAVENOUS

## 2023-12-12 MED ORDER — KETOROLAC TROMETHAMINE 15 MG/ML IJ SOLN
15.0000 mg | Freq: Once | INTRAMUSCULAR | Status: AC
  Administered 2023-12-12: 15 mg via INTRAVENOUS
  Filled 2023-12-12: qty 1

## 2023-12-12 MED ORDER — PANTOPRAZOLE SODIUM 40 MG IV SOLR
40.0000 mg | Freq: Once | INTRAVENOUS | Status: AC
Start: 2023-12-12 — End: 2023-12-12
  Administered 2023-12-12: 40 mg via INTRAVENOUS
  Filled 2023-12-12: qty 10

## 2023-12-12 MED ORDER — ONDANSETRON 4 MG PO TBDP: 4.0000 mg | ORAL_TABLET | Freq: Three times a day (TID) | ORAL | 0 refills | Status: AC | PRN

## 2023-12-12 NOTE — ED Triage Notes (Signed)
 Pt to ED from [facility] abd pain x2 weeks, reports radiating to left chest for a few days. Denies n/v/d. NAD noted

## 2023-12-12 NOTE — ED Provider Notes (Signed)
 [facility] Provider Note    Event Date/Time   First MD Initiated Contact with Patient 12/12/23 1329     (approximate)   History   Chief Complaint: Abdominal Pain and Chest Pain   HPI Encounter completed with Spanish video interpreter Jeffery Pena is a 43 y.o. male with no significant past medical history who comes ED complaining of left upper quadrant and epigastric pain that radiates to his back, intermittent for the past 2 weeks but worse in the last 3 days.  No vomiting or fever.  Does report black stool in the context of Pepto-Bismol use to try and control his symptoms.  No hematemesis.  No specific chest pain.  No shortness of breath. Patient notes symptoms are worse after eating.       Past Medical History:  Diagnosis Date   Back pain       Past Surgical History:  Procedure Laterality Date   ANKLE SURGERY      Physical Exam   Triage Vital Signs: ED Triage Vitals  Encounter Vitals Group     BP 12/12/23 1153 (!) 138/93     Girls Systolic BP Percentile --      Girls Diastolic BP Percentile --      Boys Systolic BP Percentile --      Boys Diastolic BP Percentile --      Pulse Rate 12/12/23 1153 (!) 58     Resp 12/12/23 1153 18     Temp 12/12/23 1153 98.3 F (36.8 C)     Temp src --      SpO2 12/12/23 1153 100 %     Weight 12/12/23 1155 183 lb (83 kg)     Height 12/12/23 1155 5' 8 (1.727 m)     Head Circumference --      Peak Flow --      Pain Score 12/12/23 1155 3     Pain Loc --      Pain Education --      Exclude from Growth Chart --     Most recent vital signs: Vitals:   12/12/23 1153  BP: (!) 138/93  Pulse: (!) 58  Resp: 18  Temp: 98.3 F (36.8 C)  SpO2: 100%    General: Awake, no distress.  CV:  Good peripheral perfusion.  Regular rate rhythm Resp:  Normal effort.  Clear lungs Abd:  No distention.  Soft with left upper quadrant tenderness and pronounced left lower quadrant tenderness.  No  significant tenderness in the right upper quadrant or right lower quadrant Other:  Moist oral mucosa   ED Results / Procedures / Treatments   Labs (all labs ordered are listed, but only abnormal results are displayed) Labs Reviewed  BASIC METABOLIC PANEL WITH GFR  CBC  LIPASE, BLOOD  HEPATIC FUNCTION PANEL  TROPONIN I (HIGH SENSITIVITY)     EKG Interpreted by me Sinus rhythm rate of 56.  Normal axis intervals QRS ST segments T waves   RADIOLOGY Chest x-ray interpreted by me, appears unremarkable.  Radiology report reviewed   PROCEDURES:  Procedures   MEDICATIONS ORDERED IN ED: Medications  sodium chloride  0.9 % bolus 1,000 mL (1,000 mLs Intravenous New Bag/Given 12/12/23 1419)  ondansetron  (ZOFRAN ) injection 4 mg (4 mg Intravenous Given 12/12/23 1418)  ketorolac  (TORADOL ) 15 MG/ML injection 15 mg (15 mg Intravenous Given 12/12/23 1418)  pantoprazole  (PROTONIX ) injection 40 mg (40 mg Intravenous Given 12/12/23 1418)     IMPRESSION / MDM / ASSESSMENT  AND PLAN / ED COURSE  I reviewed the triage vital signs and the nursing notes.  DDx: Pancreatitis, gastritis, cholecystitis, diverticulitis, colitis, GI perforation  Patient's presentation is most consistent with acute presentation with potential threat to life or bodily function.  Patient presents with upper abdominal pain, exam revealing pronounced left lower quadrant tenderness.  Will obtain CT due to the diagnostic uncertainty of his presentation.  CBC BMP unremarkable.  Doubt ACS PE dissection or AAA or GI bleed.  Awaiting LFTs.       FINAL CLINICAL IMPRESSION(S) / ED DIAGNOSES   Final diagnoses:  Left sided abdominal pain     Rx / DC Orders   ED Discharge Orders     None        Note:  This document was prepared using Dragon voice recognition software and may include unintentional dictation errors.   Viviann Pastor, MD 12/12/23 [id number]

## 2023-12-12 NOTE — ED Triage Notes (Addendum)
 First nurse note: Pt to Ed via POV from [facility]. Pt reports epigastric pain x2 wks intermittently. Pt also reports left sided CP w/ radiation to left arm yesterday and black tarry stools.

## 2023-12-12 NOTE — ED Provider Notes (Signed)
-----------------------------------------   5:50 PM on 12/12/2023 ----------------------------------------- Patient care assumed from Dr. Viviann.  Patient's workup is overall reassuring.  Lab work has resulted showing normal CBC with a normal white blood cell count reassuring chemistry with normal LFTs normal lipase.  Troponin negative.  CT scan is negative as well and chest x-ray is clear.  Given the patient's reassuring labs we will discharge home with a trial of nausea medication and Bentyl  to be used as needed otherwise I discussed a bland diet with the patient and supportive care at home.  Provided my typical abdominal pain return precautions.  Patient agreeable to plan of care.   Dorothyann Drivers, MD 12/12/23 1750
